# Patient Record
Sex: Female | Born: 1935 | Race: Black or African American | Hispanic: No
Health system: Southern US, Community
[De-identification: ages and names within clinical notes are randomized; demographics above are authoritative.]

---

## 2009-05-01 ENCOUNTER — Inpatient Hospital Stay (HOSPITAL_COMMUNITY): Admission: EM | Admit: 2009-05-01 | Discharge: 2009-05-16 | Payer: Self-pay | Admitting: Emergency Medicine

## 2009-05-01 ENCOUNTER — Ambulatory Visit: Payer: Self-pay | Admitting: Pulmonary Disease

## 2009-05-01 ENCOUNTER — Encounter (INDEPENDENT_AMBULATORY_CARE_PROVIDER_SITE_OTHER): Payer: Self-pay | Admitting: Neurology

## 2009-05-01 ENCOUNTER — Ambulatory Visit: Payer: Self-pay | Admitting: Emergency Medicine

## 2009-05-02 ENCOUNTER — Encounter (INDEPENDENT_AMBULATORY_CARE_PROVIDER_SITE_OTHER): Payer: Self-pay | Admitting: Neurology

## 2009-05-08 ENCOUNTER — Encounter (INDEPENDENT_AMBULATORY_CARE_PROVIDER_SITE_OTHER): Payer: Self-pay | Admitting: Internal Medicine

## 2009-05-08 ENCOUNTER — Ambulatory Visit: Payer: Self-pay | Admitting: Internal Medicine

## 2009-05-08 ENCOUNTER — Ambulatory Visit: Payer: Self-pay | Admitting: Surgery

## 2009-05-09 ENCOUNTER — Encounter: Payer: Self-pay | Admitting: Internal Medicine

## 2009-05-16 ENCOUNTER — Inpatient Hospital Stay: Admission: EM | Admit: 2009-05-16 | Discharge: 2009-06-05 | Payer: Self-pay | Admitting: Internal Medicine

## 2010-02-22 IMAGING — CR DG KNEE 1-2V PORT*L*
2 series · 2 of 2 positions shown · non-contrast
Comparison: None

CLINICAL DATA: History given of pain in the left knee anterior
aspect with no known injury.

PORTABLE LEFT KNEE - 1-2 VIEW

[view not recorded (1 of 2)]
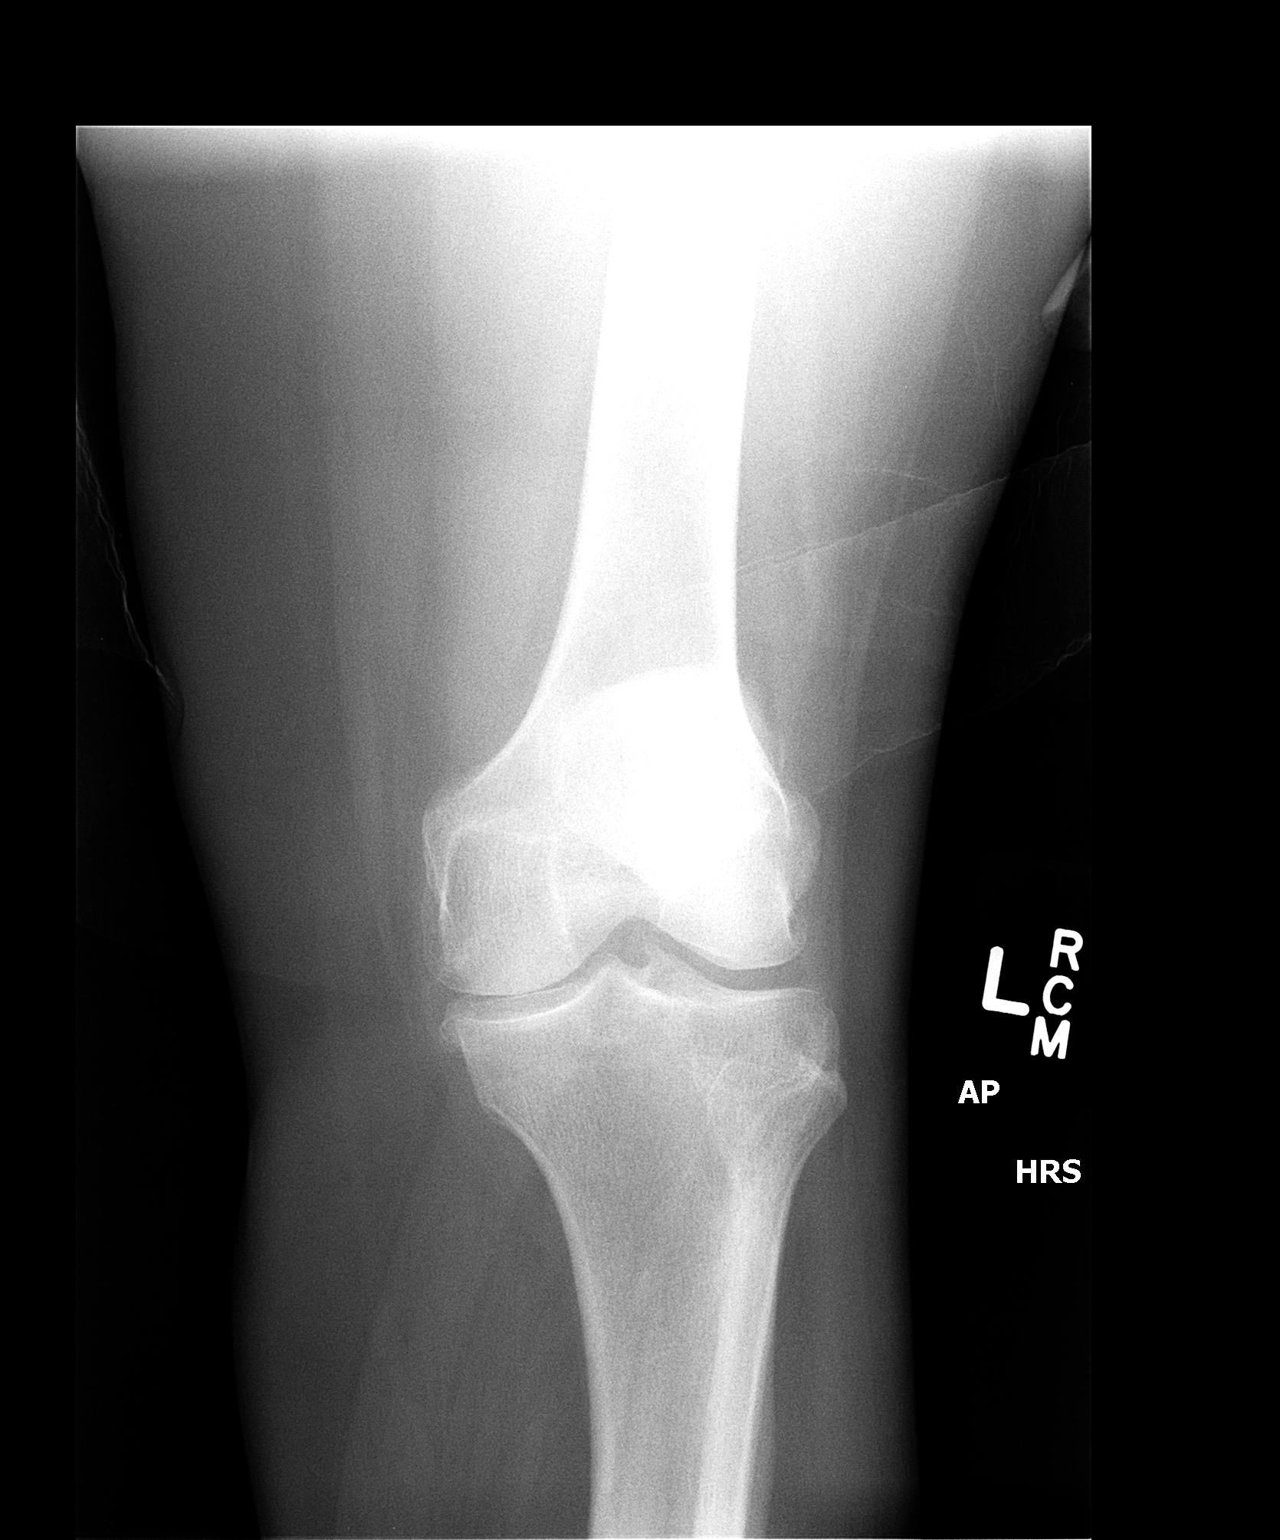

[view not recorded (2 of 2)]
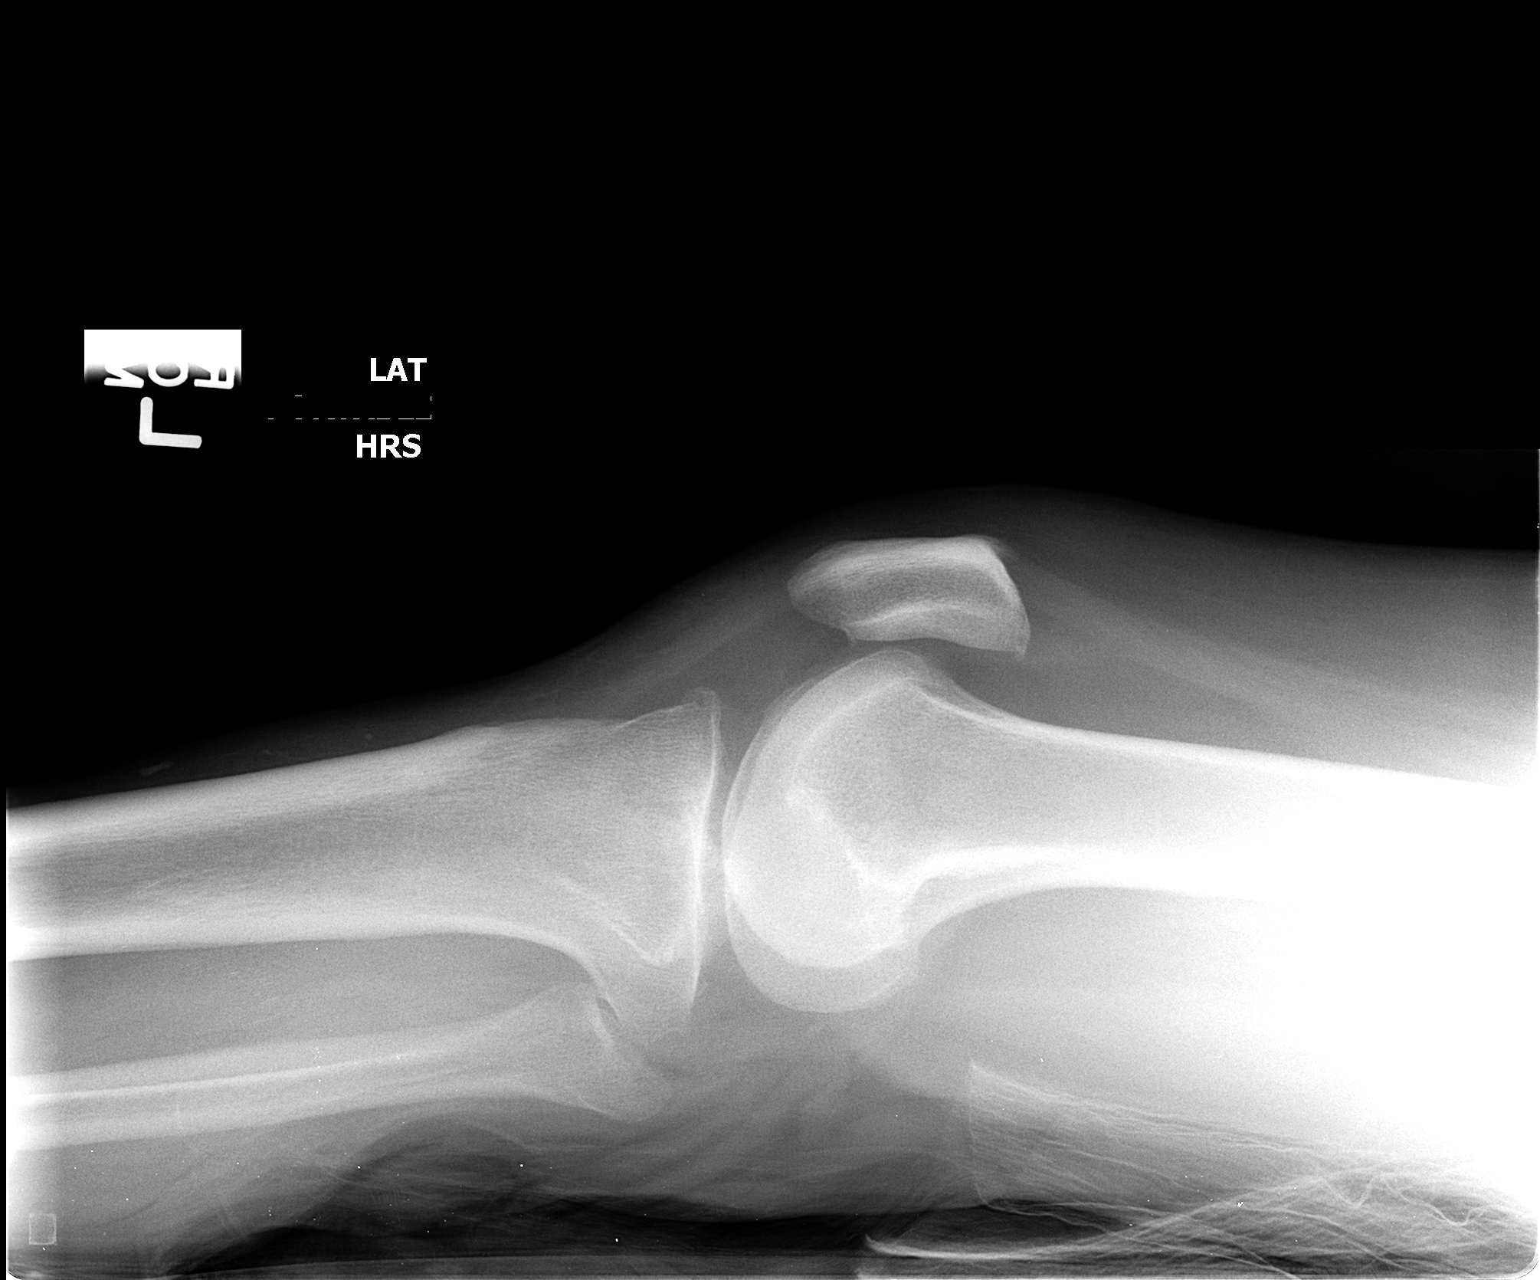

[2 of 2 positions shown; findings below may reference images not displayed]

FINDINGS: No joint effusion was evident.  There is minimal patellar
spurring.  No fracture or bony destruction is seen. There is a
mildly osteopenic appearance of the bones. There is minimal
marginal osteophyte formation involving the proximal medial aspect
of tibia.
IMPRESSION: No fracture or joint effusion is seen.  Minimal spurring is seen.
There is an osteopenic appearance of the bones.

## 2010-02-22 IMAGING — CR DG HIP 1V PORT*L*
1 series · 1 of 1 positions shown · non-contrast
Comparison: None

CLINICAL DATA: History of left hip pain with no known injury.

PORTABLE LEFT HIP - 1 VIEW

[view not recorded]
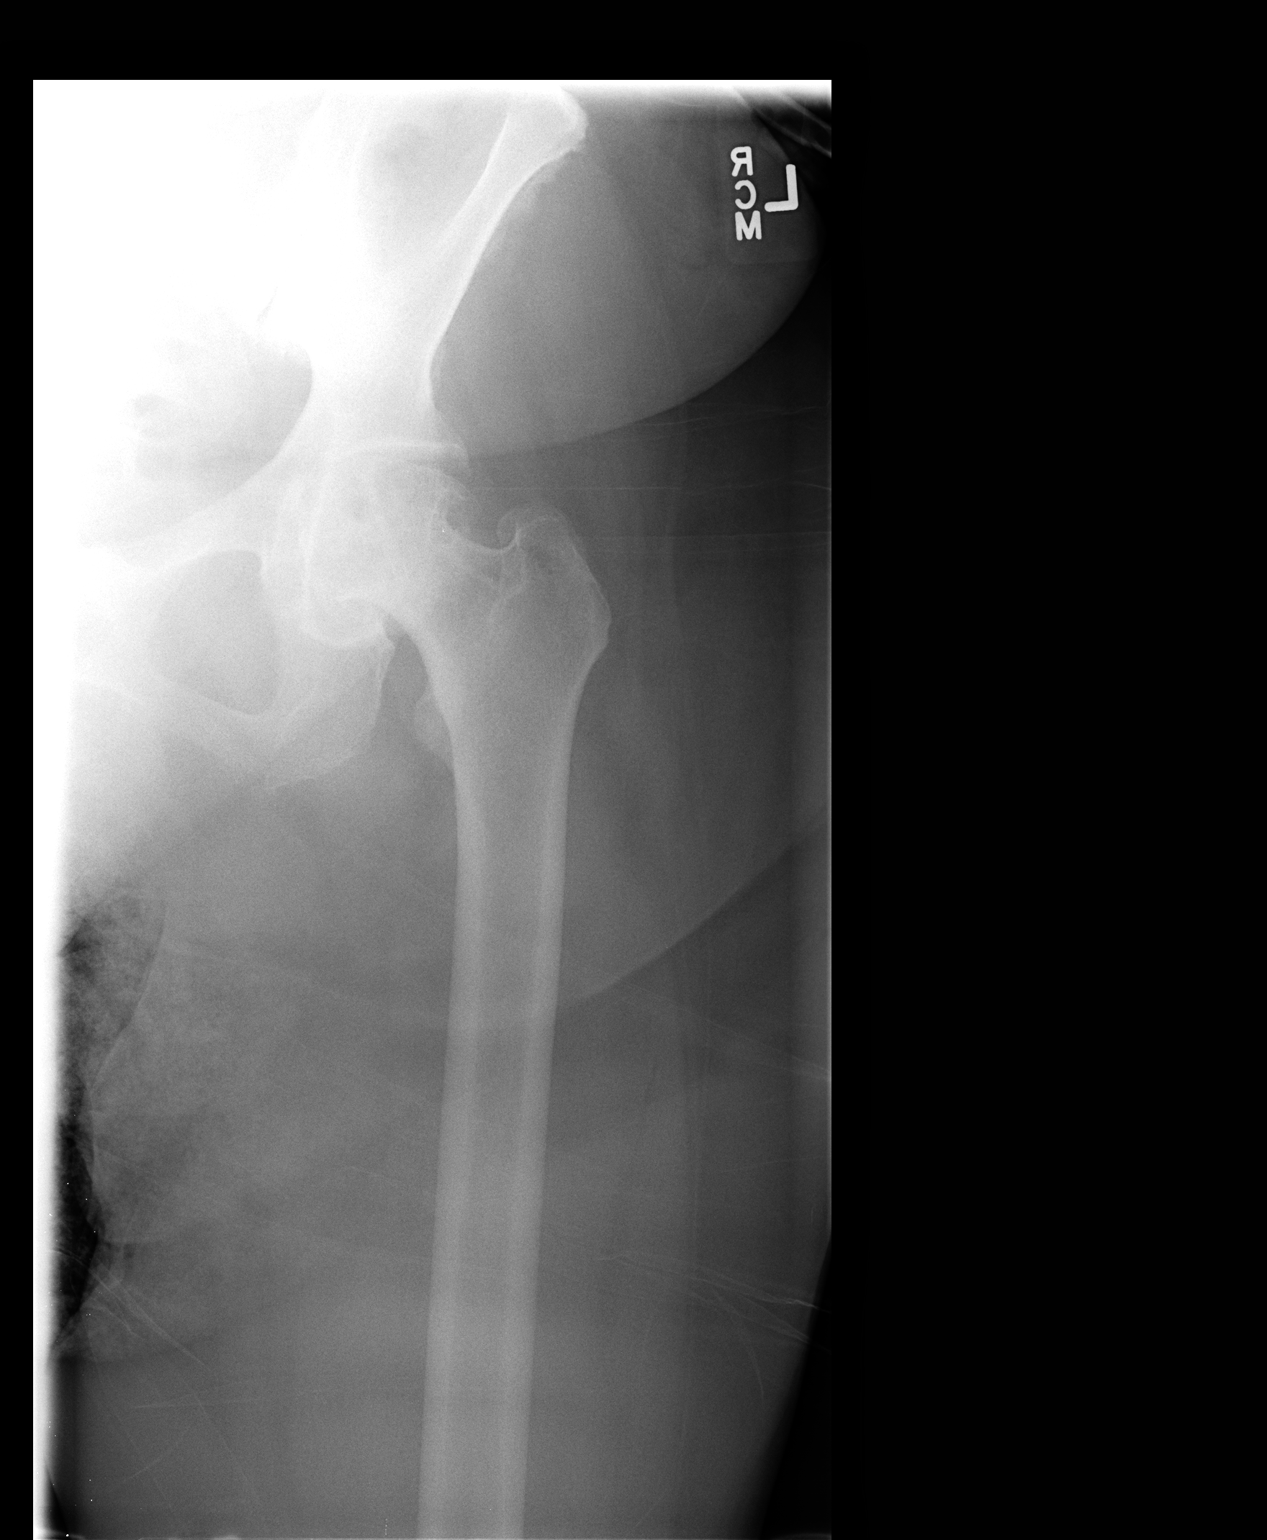

[1 of 1 positions shown; findings below may reference images not displayed]

FINDINGS: Portable AP image was obtained.  There is narrowing of
the superior joint space and medial joint space.  There is marginal
osteophyte formation.  There is an osteopenic appearance the bones.
No fracture or bony destruction is seen to
IMPRESSION: There is a mildly osteopenic appearance of the bones. There is hip
osteoarthritis.

## 2010-02-23 IMAGING — CR DG CHEST 1V PORT
1 series · 1 of 1 positions shown · non-contrast
Comparison: 05/19/2009

CLINICAL DATA: Respiratory failure.  CVA.

PORTABLE CHEST - 1 VIEW

[view not recorded]
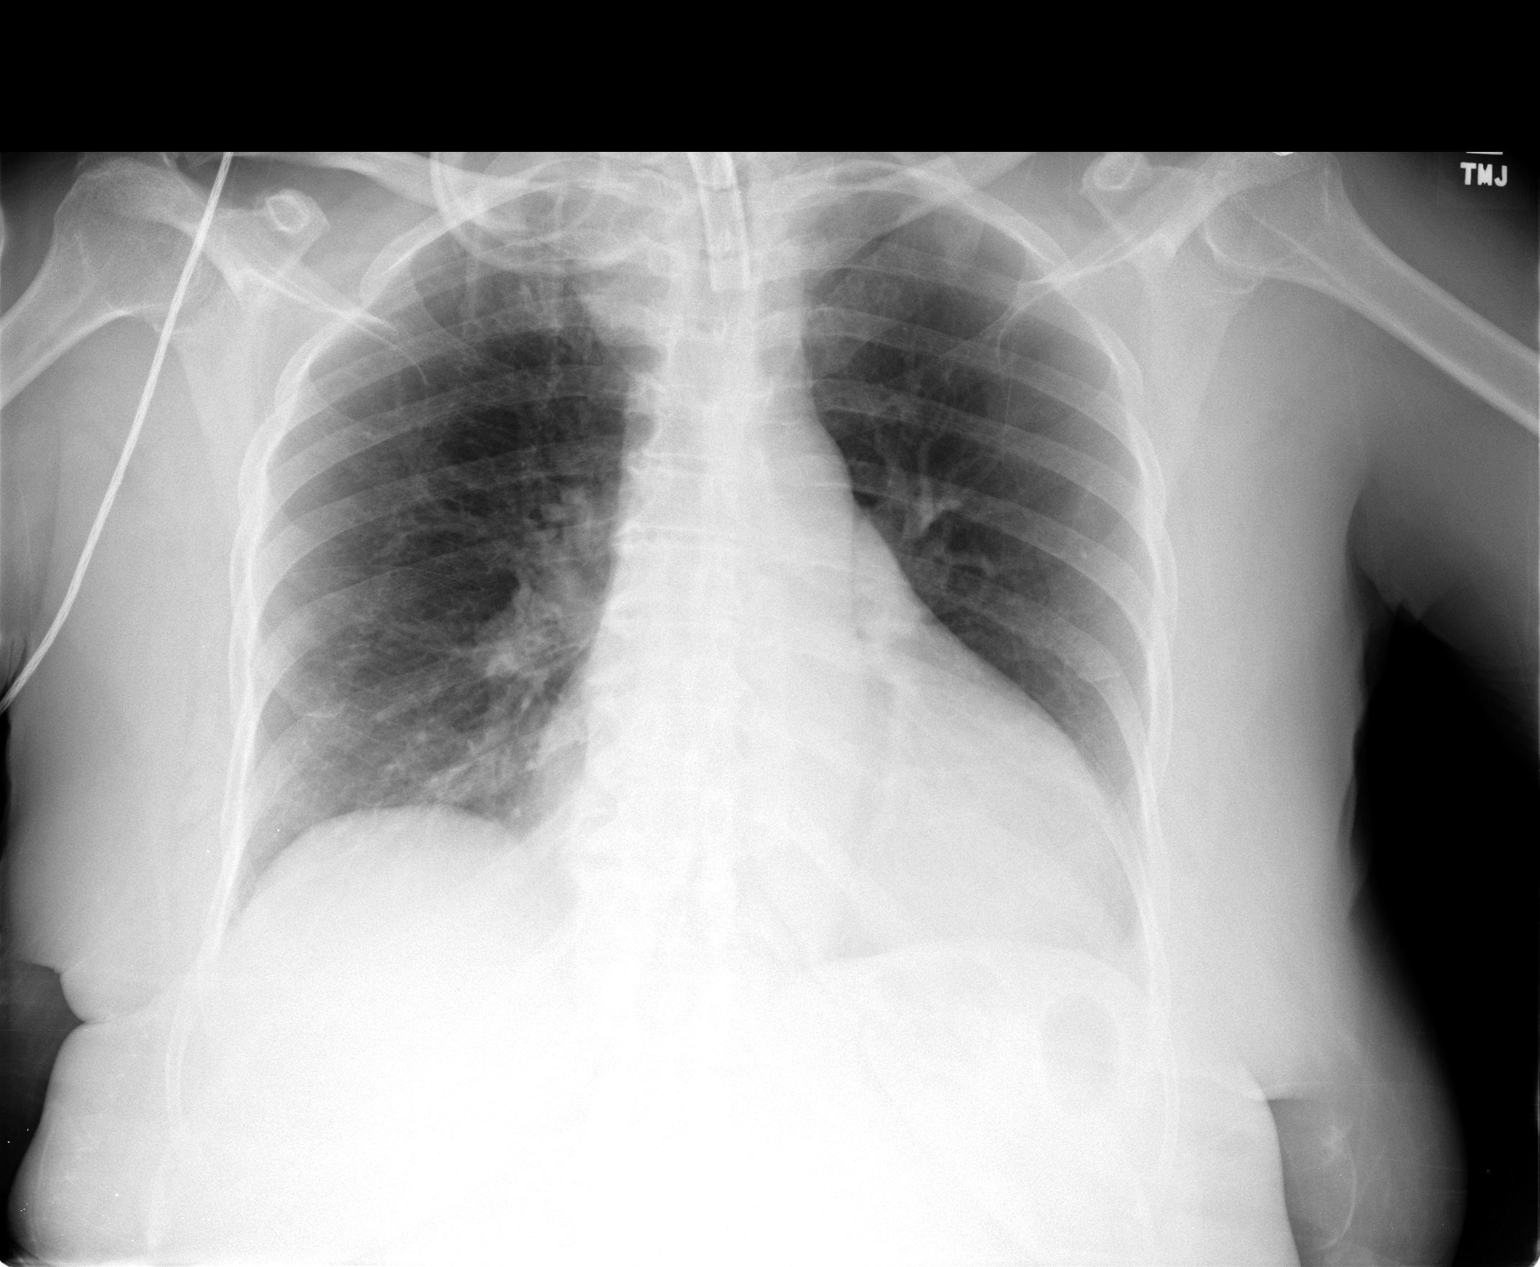

[1 of 1 positions shown; findings below may reference images not displayed]

FINDINGS: Cardiomegaly.  No acute chest findings.  Lungs well
expanded.  Tracheostomy is in satisfactory position.
IMPRESSION: Cardiomegaly.

## 2010-05-24 ENCOUNTER — Encounter: Payer: Self-pay | Admitting: Internal Medicine

## 2010-06-02 NOTE — Procedures (Signed)
Summary: Upper Endoscopy w/PEG  Patient: Olivia Anthony Note: All result statuses are Final unless otherwise noted.  Tests: (1) Upper Endoscopy w/PEG (UEP)  UEP Upper Endoscopy w/PEG                             DONE     [facility]     [address]     [place], [place]  [address]           OPERATIVE PROCEDURE REPORT           PATIENT:  Olivia Anthony, Olivia Anthony  MR#:  [id number]     BIRTHDATE:  1933/10/09, 75 yrs. old  GENDER:  female           ENDOSCOPIST:  Iva Boop, MD, [facility]           PROCEDURE DATE:  05/09/2009     PROCEDURE:  EGD with PEG placement     ASA CLASS:  Class III     INDICATIONS:  1) dysphagia after a stroke           MEDICATIONS:   Fentanyl 25 mcg IV, Versed 2 mg IV     TOPICAL ANESTHETIC:  none           DESCRIPTION OF PROCEDURE:  After the risks benefits and     alternatives of the procedure were thoroughly explained, informed     consent was obtained.  The  endoscope was introduced through the     mouth and advanced to the second portion of the duodenum, without     limitations.  The instrument was slowly withdrawn as the mucosa     was fully examined.           The upper, middle, and distal third of the esophagus were     carefully inspected and no abnormalities were noted. The z-line     was well seen at the GEJ. The endoscope was pushed into the fundus     which was normal including a retroflexed view. The antrum, first     and second part of the duodenum were unremarkable.     <<PROCEDUREIMAGES>>           The stomach was then inflated with air, and by a combination of     transillumination and manual palpation, the site for the     gastrostomy tube placement was selected and marked on the anterior     abdominal wall.  The skin of the anterior abdomen was surgically     prepped and draped with sterile towels.           Utilizing strict sterile technique, the selected site was then     anesthetized with 1% xylocaine by injection into  the skin and     subcutaneous tissue.  A 1 cm incision was made through the skin     and subcutaneous tissue, and the needle/cannula assembly was then     passed through the abdominal wall and through the anterior wall of     the stomach, maintaining visualization with the endoscope.  A     snare device previously placed through the instrument channel was     then opened and placed around the cannula, the needle was removed,     and the insertion wire was passed through the cannula and into the     stomach lumen.  The snare was then  loosened from the cannula, and     repositioned to snare the insertion wire.  The snare was then     pulled up to the endoscope distal tip, and the scope was then     withdrawn bringing with it the snare and insertion wire.           The insertion wire was then released from the snare, and then     loop-attached to the [employer] gastrostomy tube.     Using the "push technique", the G-tube was then placed The G-tube     insertion site was then cleansed once again, and the external     bolster was placed over the tube to secure it to the abdominal     wall.  A sterile dressing was then applied, and the procedure     terminated.           COMPLICATIONS:  None           ENDOSCOPIC IMPRESSION:     1) Normal EGD - successful PEG     RECOMMENDATIONS:     routine post-PEG care           REPEAT EXAM:  as needed           Iva Boop, MD, [facility]           n.     eSIGNED:   Iva Boop at 05/09/2009 05:30 PM           Simon Rhein, [id number]  Note: An exclamation mark (!) indicates a result that was not dispersed into the flowsheet. Document Creation Date: 05/09/2009 5:30 PM _______________________________________________________________________  (1) Order result status: Final Collection or observation date-time: 05/09/2009 15:54 Requested date-time:  Receipt date-time:  Reported date-time:  Referring Physician:   Ordering  Physician: Stan Head [id number]) Specimen Source:  Source: Launa Grill Order Number: [id number] Lab site:

## 2010-07-19 LAB — BASIC METABOLIC PANEL
BUN: 13 mg/dL (ref 6–23)
BUN: 13 mg/dL (ref 6–23)
BUN: 16 mg/dL (ref 6–23)
BUN: 23 mg/dL (ref 6–23)
BUN: 23 mg/dL (ref 6–23)
BUN: 6 mg/dL (ref 6–23)
BUN: 8 mg/dL (ref 6–23)
CO2: 29 mEq/L (ref 19–32)
CO2: 31 mEq/L (ref 19–32)
Calcium: 9.1 mg/dL (ref 8.4–10.5)
Calcium: 9.3 mg/dL (ref 8.4–10.5)
Calcium: 9.4 mg/dL (ref 8.4–10.5)
Calcium: 9.7 mg/dL (ref 8.4–10.5)
Calcium: 9.9 mg/dL (ref 8.4–10.5)
Calcium: 9.9 mg/dL (ref 8.4–10.5)
Chloride: 107 mEq/L (ref 96–112)
Chloride: 114 mEq/L — ABNORMAL HIGH (ref 96–112)
Chloride: 115 mEq/L — ABNORMAL HIGH (ref 96–112)
Chloride: 98 mEq/L (ref 96–112)
Creatinine, Ser: 0.7 mg/dL (ref 0.4–1.2)
Creatinine, Ser: 0.7 mg/dL (ref 0.4–1.2)
Creatinine, Ser: 0.9 mg/dL (ref 0.4–1.2)
Creatinine, Ser: 0.92 mg/dL (ref 0.4–1.2)
Creatinine, Ser: 0.94 mg/dL (ref 0.4–1.2)
GFR calc Af Amer: >60 mL/min (ref >60)
GFR calc Af Amer: >60 mL/min (ref >60)
GFR calc Af Amer: >60 mL/min (ref >60)
GFR calc Af Amer: >60 mL/min (ref >60)
GFR calc non Af Amer: >60 mL/min (ref >60)
GFR calc non Af Amer: >60 mL/min (ref >60)
GFR calc non Af Amer: >60 mL/min (ref >60)
GFR calc non Af Amer: >60 mL/min (ref >60)
GFR calc non Af Amer: >60 mL/min (ref >60)
GFR calc non Af Amer: >60 mL/min (ref >60)
GFR calc non Af Amer: >60 mL/min (ref >60)
Glucose, Bld: 102 mg/dL — ABNORMAL HIGH (ref 70–99)
Glucose, Bld: 135 mg/dL — ABNORMAL HIGH (ref 70–99)
Glucose, Bld: 139 mg/dL — ABNORMAL HIGH (ref 70–99)
Glucose, Bld: 148 mg/dL — ABNORMAL HIGH (ref 70–99)
Glucose, Bld: 176 mg/dL — ABNORMAL HIGH (ref 70–99)
Glucose, Bld: 179 mg/dL — ABNORMAL HIGH (ref 70–99)
Potassium: 3.2 mEq/L — ABNORMAL LOW (ref 3.5–5.1)
Potassium: 3.6 mEq/L (ref 3.5–5.1)
Potassium: 3.8 mEq/L (ref 3.5–5.1)
Potassium: 4.1 mEq/L (ref 3.5–5.1)
Sodium: 140 mEq/L (ref 135–145)
Sodium: 146 mEq/L — ABNORMAL HIGH (ref 135–145)
Sodium: 150 mEq/L — ABNORMAL HIGH (ref 135–145)
Sodium: 151 mEq/L — ABNORMAL HIGH (ref 135–145)

## 2010-07-19 LAB — CULTURE, BLOOD (ROUTINE X 2): Culture: NO GROWTH

## 2010-07-19 LAB — GLUCOSE, CAPILLARY
Glucose-Capillary: 100 mg/dL — ABNORMAL HIGH (ref 70–99)
Glucose-Capillary: 101 mg/dL — ABNORMAL HIGH (ref 70–99)
Glucose-Capillary: 103 mg/dL — ABNORMAL HIGH (ref 70–99)
Glucose-Capillary: 103 mg/dL — ABNORMAL HIGH (ref 70–99)
Glucose-Capillary: 104 mg/dL — ABNORMAL HIGH (ref 70–99)
Glucose-Capillary: 108 mg/dL — ABNORMAL HIGH (ref 70–99)
Glucose-Capillary: 108 mg/dL — ABNORMAL HIGH (ref 70–99)
Glucose-Capillary: 108 mg/dL — ABNORMAL HIGH (ref 70–99)
Glucose-Capillary: 111 mg/dL — ABNORMAL HIGH (ref 70–99)
Glucose-Capillary: 112 mg/dL — ABNORMAL HIGH (ref 70–99)
Glucose-Capillary: 113 mg/dL — ABNORMAL HIGH (ref 70–99)
Glucose-Capillary: 115 mg/dL — ABNORMAL HIGH (ref 70–99)
Glucose-Capillary: 116 mg/dL — ABNORMAL HIGH (ref 70–99)
Glucose-Capillary: 119 mg/dL — ABNORMAL HIGH (ref 70–99)
Glucose-Capillary: 120 mg/dL — ABNORMAL HIGH (ref 70–99)
Glucose-Capillary: 125 mg/dL — ABNORMAL HIGH (ref 70–99)
Glucose-Capillary: 125 mg/dL — ABNORMAL HIGH (ref 70–99)
Glucose-Capillary: 125 mg/dL — ABNORMAL HIGH (ref 70–99)
Glucose-Capillary: 126 mg/dL — ABNORMAL HIGH (ref 70–99)
Glucose-Capillary: 126 mg/dL — ABNORMAL HIGH (ref 70–99)
Glucose-Capillary: 129 mg/dL — ABNORMAL HIGH (ref 70–99)
Glucose-Capillary: 130 mg/dL — ABNORMAL HIGH (ref 70–99)
Glucose-Capillary: 135 mg/dL — ABNORMAL HIGH (ref 70–99)
Glucose-Capillary: 136 mg/dL — ABNORMAL HIGH (ref 70–99)
Glucose-Capillary: 136 mg/dL — ABNORMAL HIGH (ref 70–99)
Glucose-Capillary: 137 mg/dL — ABNORMAL HIGH (ref 70–99)
Glucose-Capillary: 137 mg/dL — ABNORMAL HIGH (ref 70–99)
Glucose-Capillary: 143 mg/dL — ABNORMAL HIGH (ref 70–99)
Glucose-Capillary: 144 mg/dL — ABNORMAL HIGH (ref 70–99)
Glucose-Capillary: 148 mg/dL — ABNORMAL HIGH (ref 70–99)
Glucose-Capillary: 152 mg/dL — ABNORMAL HIGH (ref 70–99)
Glucose-Capillary: 159 mg/dL — ABNORMAL HIGH (ref 70–99)
Glucose-Capillary: 160 mg/dL — ABNORMAL HIGH (ref 70–99)
Glucose-Capillary: 226 mg/dL — ABNORMAL HIGH (ref 70–99)
Glucose-Capillary: 91 mg/dL (ref 70–99)
Glucose-Capillary: 93 mg/dL (ref 70–99)
Glucose-Capillary: 94 mg/dL (ref 70–99)
Glucose-Capillary: 97 mg/dL (ref 70–99)

## 2010-07-19 LAB — FOLATE RBC: RBC Folate: 1415 ng/mL — ABNORMAL HIGH (ref 180–600)

## 2010-07-19 LAB — BLOOD GAS, ARTERIAL
Acid-Base Excess: 1.6 mmol/L (ref 0.0–2.0)
Acid-Base Excess: 5.2 mmol/L — ABNORMAL HIGH (ref 0.0–2.0)
Bicarbonate: 28.2 mEq/L — ABNORMAL HIGH (ref 20.0–24.0)
Drawn by: 320991
FIO2: 0.3 %
FIO2: 1 %
MECHVT: 500 mL
O2 Saturation: 100 %
PEEP: 5 cmH2O
Patient temperature: 98.6
RATE: 16 resp/min
TCO2: 29.2 mmol/L (ref 0–100)
pCO2 arterial: 34.1 mmHg — ABNORMAL LOW (ref 35.0–45.0)
pO2, Arterial: 116 mmHg — ABNORMAL HIGH (ref 80.0–100.0)

## 2010-07-19 LAB — COMPREHENSIVE METABOLIC PANEL
ALT: 21 U/L (ref 0–35)
ALT: 26 U/L (ref 0–35)
ALT: 29 U/L (ref 0–35)
AST: 19 U/L (ref 0–37)
AST: 27 U/L (ref 0–37)
AST: 36 U/L (ref 0–37)
Albumin: 2.5 g/dL — ABNORMAL LOW (ref 3.5–5.2)
Alkaline Phosphatase: 85 U/L (ref 39–117)
Alkaline Phosphatase: 93 U/L (ref 39–117)
CO2: 28 mEq/L (ref 19–32)
CO2: 29 mEq/L (ref 19–32)
Calcium: 8.3 mg/dL — ABNORMAL LOW (ref 8.4–10.5)
Calcium: 9.6 mg/dL (ref 8.4–10.5)
Chloride: 111 mEq/L (ref 96–112)
Chloride: 95 mEq/L — ABNORMAL LOW (ref 96–112)
Creatinine, Ser: 0.76 mg/dL (ref 0.4–1.2)
GFR calc Af Amer: >60 mL/min (ref >60)
GFR calc Af Amer: >60 mL/min (ref >60)
GFR calc Af Amer: >60 mL/min (ref >60)
GFR calc non Af Amer: >60 mL/min (ref >60)
GFR calc non Af Amer: >60 mL/min (ref >60)
Glucose, Bld: 106 mg/dL — ABNORMAL HIGH (ref 70–99)
Glucose, Bld: 124 mg/dL — ABNORMAL HIGH (ref 70–99)
Potassium: 3.6 mEq/L (ref 3.5–5.1)
Potassium: 4.2 mEq/L (ref 3.5–5.1)
Sodium: 133 mEq/L — ABNORMAL LOW (ref 135–145)
Sodium: 153 mEq/L — ABNORMAL HIGH (ref 135–145)
Sodium: 154 mEq/L — ABNORMAL HIGH (ref 135–145)
Total Bilirubin: 0.4 mg/dL (ref 0.3–1.2)
Total Protein: 6.1 g/dL (ref 6.0–8.3)
Total Protein: 6.8 g/dL (ref 6.0–8.3)

## 2010-07-19 LAB — CBC
HCT: 25 % — ABNORMAL LOW (ref 36.0–46.0)
HCT: 27.4 % — ABNORMAL LOW (ref 36.0–46.0)
HCT: 27.5 % — ABNORMAL LOW (ref 36.0–46.0)
HCT: 29.8 % — ABNORMAL LOW (ref 36.0–46.0)
HCT: 29.9 % — ABNORMAL LOW (ref 36.0–46.0)
Hemoglobin: 8 g/dL — ABNORMAL LOW (ref 12.0–15.0)
Hemoglobin: 8.6 g/dL — ABNORMAL LOW (ref 12.0–15.0)
Hemoglobin: 9 g/dL — ABNORMAL LOW (ref 12.0–15.0)
Hemoglobin: 9.1 g/dL — ABNORMAL LOW (ref 12.0–15.0)
Hemoglobin: 9.3 g/dL — ABNORMAL LOW (ref 12.0–15.0)
Hemoglobin: 9.8 g/dL — ABNORMAL LOW (ref 12.0–15.0)
MCHC: 33.3 g/dL (ref 30.0–36.0)
MCHC: 33.6 g/dL (ref 30.0–36.0)
MCHC: 34.2 g/dL (ref 30.0–36.0)
MCV: 86.1 fL (ref 78.0–100.0)
MCV: 86.5 fL (ref 78.0–100.0)
MCV: 86.7 fL (ref 78.0–100.0)
MCV: 86.9 fL (ref 78.0–100.0)
MCV: 87.7 fL (ref 78.0–100.0)
MCV: 87.9 fL (ref 78.0–100.0)
MCV: 88.3 fL (ref 78.0–100.0)
Platelets: 171 10*3/uL (ref 150–400)
Platelets: 188 10*3/uL (ref 150–400)
Platelets: 311 10*3/uL (ref 150–400)
Platelets: 373 10*3/uL (ref 150–400)
Platelets: 395 10*3/uL (ref 150–400)
Platelets: 486 10*3/uL — ABNORMAL HIGH (ref 150–400)
RBC: 2.9 MIL/uL — ABNORMAL LOW (ref 3.87–5.11)
RBC: 2.94 MIL/uL — ABNORMAL LOW (ref 3.87–5.11)
RBC: 3.03 MIL/uL — ABNORMAL LOW (ref 3.87–5.11)
RBC: 3.13 MIL/uL — ABNORMAL LOW (ref 3.87–5.11)
RBC: 3.16 MIL/uL — ABNORMAL LOW (ref 3.87–5.11)
RBC: 3.29 MIL/uL — ABNORMAL LOW (ref 3.87–5.11)
RBC: 3.38 MIL/uL — ABNORMAL LOW (ref 3.87–5.11)
RBC: 3.39 MIL/uL — ABNORMAL LOW (ref 3.87–5.11)
RDW: 13.9 % (ref 11.5–15.5)
RDW: 14.6 % (ref 11.5–15.5)
RDW: 14.6 % (ref 11.5–15.5)
RDW: 14.7 % (ref 11.5–15.5)
WBC: 12.1 10*3/uL — ABNORMAL HIGH (ref 4.0–10.5)
WBC: 14.1 10*3/uL — ABNORMAL HIGH (ref 4.0–10.5)
WBC: 16.4 10*3/uL — ABNORMAL HIGH (ref 4.0–10.5)
WBC: 17.8 10*3/uL — ABNORMAL HIGH (ref 4.0–10.5)
WBC: 19.5 10*3/uL — ABNORMAL HIGH (ref 4.0–10.5)

## 2010-07-19 LAB — CULTURE, RESPIRATORY W GRAM STAIN

## 2010-07-19 LAB — URINALYSIS, ROUTINE W REFLEX MICROSCOPIC
Bilirubin Urine: NEGATIVE
Ketones, ur: NEGATIVE mg/dL
Specific Gravity, Urine: 1.017 (ref 1.005–1.030)
pH: 5 (ref 5.0–8.0)

## 2010-07-19 LAB — URINALYSIS, MICROSCOPIC ONLY
Glucose, UA: NEGATIVE mg/dL
Ketones, ur: NEGATIVE mg/dL
Protein, ur: 30 mg/dL — AB
Urobilinogen, UA: 1 mg/dL (ref 0.0–1.0)

## 2010-07-19 LAB — PROTIME-INR
INR: 1.32 (ref 0.00–1.49)
Prothrombin Time: 16.3 seconds — ABNORMAL HIGH (ref 11.6–15.2)

## 2010-07-19 LAB — DIFFERENTIAL
Basophils Relative: 0 % (ref 0–1)
Eosinophils Absolute: 0.3 10*3/uL (ref 0.0–0.7)
Eosinophils Relative: 2 % (ref 0–5)
Lymphocytes Relative: 12 % (ref 12–46)
Lymphocytes Relative: 13 % (ref 12–46)
Lymphs Abs: 1.6 10*3/uL (ref 0.7–4.0)
Lymphs Abs: 2 10*3/uL (ref 0.7–4.0)
Monocytes Absolute: 0.9 10*3/uL (ref 0.1–1.0)
Monocytes Relative: 6 % (ref 3–12)
Monocytes Relative: 7 % (ref 3–12)
Neutro Abs: 13 10*3/uL — ABNORMAL HIGH (ref 1.7–7.7)

## 2010-07-19 LAB — PHOSPHORUS
Phosphorus: 3.4 mg/dL (ref 2.3–4.6)
Phosphorus: 3.5 mg/dL (ref 2.3–4.6)

## 2010-07-19 LAB — URINE CULTURE
Culture: NO GROWTH
Culture: NO GROWTH

## 2010-07-19 LAB — VITAMIN B12: Vitamin B-12: 718 pg/mL (ref 211–911)

## 2010-07-19 LAB — PREALBUMIN: Prealbumin: 12.7 mg/dL — ABNORMAL LOW (ref 18.0–45.0)

## 2010-07-19 LAB — MAGNESIUM: Magnesium: 2 mg/dL (ref 1.5–2.5)

## 2010-07-19 LAB — CLOSTRIDIUM DIFFICILE EIA

## 2010-07-19 LAB — LACTIC ACID, PLASMA: Lactic Acid, Venous: 1.9 mmol/L (ref 0.5–2.2)

## 2010-07-19 LAB — BRAIN NATRIURETIC PEPTIDE: Pro B Natriuretic peptide (BNP): 140 pg/mL — ABNORMAL HIGH (ref 0.0–100.0)

## 2010-07-19 LAB — TSH: TSH: 2.869 u[IU]/mL (ref 0.350–4.500)

## 2010-07-20 LAB — URINALYSIS, MICROSCOPIC ONLY
Glucose, UA: NEGATIVE mg/dL
Hgb urine dipstick: NEGATIVE
Specific Gravity, Urine: 1.022 (ref 1.005–1.030)
Urobilinogen, UA: 0.2 mg/dL (ref 0.0–1.0)

## 2010-07-20 LAB — BASIC METABOLIC PANEL
BUN: 38 mg/dL — ABNORMAL HIGH (ref 6–23)
BUN: 41 mg/dL — ABNORMAL HIGH (ref 6–23)
CO2: 27 mEq/L (ref 19–32)
CO2: 30 mEq/L (ref 19–32)
CO2: 32 mEq/L (ref 19–32)
Calcium: 10 mg/dL (ref 8.4–10.5)
Calcium: 10.3 mg/dL (ref 8.4–10.5)
Calcium: 9.9 mg/dL (ref 8.4–10.5)
Calcium: 9.9 mg/dL (ref 8.4–10.5)
Chloride: 103 mEq/L (ref 96–112)
Creatinine, Ser: 0.77 mg/dL (ref 0.4–1.2)
GFR calc non Af Amer: 60 mL/min — ABNORMAL LOW (ref >60)
GFR calc non Af Amer: >60 mL/min (ref >60)
GFR calc non Af Amer: >60 mL/min (ref >60)
Glucose, Bld: 131 mg/dL — ABNORMAL HIGH (ref 70–99)
Glucose, Bld: 145 mg/dL — ABNORMAL HIGH (ref 70–99)
Glucose, Bld: 146 mg/dL — ABNORMAL HIGH (ref 70–99)
Glucose, Bld: 150 mg/dL — ABNORMAL HIGH (ref 70–99)
Potassium: 4.3 mEq/L (ref 3.5–5.1)
Potassium: 4.8 mEq/L (ref 3.5–5.1)
Potassium: 5.1 mEq/L (ref 3.5–5.1)
Sodium: 133 mEq/L — ABNORMAL LOW (ref 135–145)
Sodium: 137 mEq/L (ref 135–145)
Sodium: 141 mEq/L (ref 135–145)

## 2010-07-20 LAB — COMPREHENSIVE METABOLIC PANEL
ALT: 25 U/L (ref 0–35)
ALT: 44 U/L — ABNORMAL HIGH (ref 0–35)
AST: 26 U/L (ref 0–37)
AST: 31 U/L (ref 0–37)
Albumin: 2.7 g/dL — ABNORMAL LOW (ref 3.5–5.2)
Alkaline Phosphatase: 89 U/L (ref 39–117)
CO2: 27 mEq/L (ref 19–32)
Calcium: 10.4 mg/dL (ref 8.4–10.5)
Chloride: 108 mEq/L (ref 96–112)
Creatinine, Ser: 1.25 mg/dL — ABNORMAL HIGH (ref 0.4–1.2)
GFR calc Af Amer: 51 mL/min — ABNORMAL LOW (ref >60)
GFR calc Af Amer: >60 mL/min (ref >60)
GFR calc non Af Amer: >60 mL/min (ref >60)
Glucose, Bld: 139 mg/dL — ABNORMAL HIGH (ref 70–99)
Potassium: 3.7 mEq/L (ref 3.5–5.1)
Sodium: 145 mEq/L (ref 135–145)
Sodium: 149 mEq/L — ABNORMAL HIGH (ref 135–145)

## 2010-07-20 LAB — FERRITIN: Ferritin: 377 ng/mL — ABNORMAL HIGH (ref 10–291)

## 2010-07-20 LAB — CLOSTRIDIUM DIFFICILE EIA
C difficile Toxins A+B, EIA: 19
C difficile Toxins A+B, EIA: NEGATIVE

## 2010-07-20 LAB — CBC
HCT: 27.4 % — ABNORMAL LOW (ref 36.0–46.0)
HCT: 28.6 % — ABNORMAL LOW (ref 36.0–46.0)
HCT: 28.8 % — ABNORMAL LOW (ref 36.0–46.0)
Hemoglobin: 9.3 g/dL — ABNORMAL LOW (ref 12.0–15.0)
Hemoglobin: 9.5 g/dL — ABNORMAL LOW (ref 12.0–15.0)
Hemoglobin: 9.5 g/dL — ABNORMAL LOW (ref 12.0–15.0)
Hemoglobin: 9.7 g/dL — ABNORMAL LOW (ref 12.0–15.0)
MCHC: 32.6 g/dL (ref 30.0–36.0)
MCHC: 33 g/dL (ref 30.0–36.0)
MCHC: 33.1 g/dL (ref 30.0–36.0)
MCHC: 33.2 g/dL (ref 30.0–36.0)
MCV: 85.5 fL (ref 78.0–100.0)
MCV: 87.3 fL (ref 78.0–100.0)
Platelets: 339 10*3/uL (ref 150–400)
Platelets: 538 10*3/uL — ABNORMAL HIGH (ref 150–400)
Platelets: 548 10*3/uL — ABNORMAL HIGH (ref 150–400)
RBC: 3.39 MIL/uL — ABNORMAL LOW (ref 3.87–5.11)
RBC: 3.39 MIL/uL — ABNORMAL LOW (ref 3.87–5.11)
RDW: 14.2 % (ref 11.5–15.5)
RDW: 14.3 % (ref 11.5–15.5)
RDW: 14.3 % (ref 11.5–15.5)
RDW: 14.4 % (ref 11.5–15.5)
RDW: 14.6 % (ref 11.5–15.5)
WBC: 13.3 10*3/uL — ABNORMAL HIGH (ref 4.0–10.5)
WBC: 14.4 10*3/uL — ABNORMAL HIGH (ref 4.0–10.5)
WBC: 15.4 10*3/uL — ABNORMAL HIGH (ref 4.0–10.5)

## 2010-07-20 LAB — HEMOGLOBIN A1C: Hgb A1c MFr Bld: 6.2 % — ABNORMAL HIGH (ref 4.6–6.1)

## 2010-07-20 LAB — RETICULOCYTES: Retic Ct Pct: 2 % (ref 0.4–3.1)

## 2010-07-20 LAB — DIFFERENTIAL
Basophils Relative: 0 % (ref 0–1)
Eosinophils Absolute: 0.4 10*3/uL (ref 0.0–0.7)
Eosinophils Absolute: 0.5 10*3/uL (ref 0.0–0.7)
Eosinophils Relative: 3 % (ref 0–5)
Eosinophils Relative: 3 % (ref 0–5)
Lymphocytes Relative: 14 % (ref 12–46)
Lymphs Abs: 1.9 10*3/uL (ref 0.7–4.0)
Lymphs Abs: 2.3 10*3/uL (ref 0.7–4.0)
Monocytes Absolute: 0.6 10*3/uL (ref 0.1–1.0)
Monocytes Relative: 4 % (ref 3–12)
Neutrophils Relative %: 76 % (ref 43–77)

## 2010-07-20 LAB — CULTURE, BLOOD (ROUTINE X 2): Culture: NO GROWTH

## 2010-07-20 LAB — MAGNESIUM
Magnesium: 2 mg/dL (ref 1.5–2.5)
Magnesium: 2.2 mg/dL (ref 1.5–2.5)
Magnesium: 3.1 mg/dL — ABNORMAL HIGH (ref 1.5–2.5)

## 2010-07-20 LAB — IRON AND TIBC
Iron: 20 ug/dL — ABNORMAL LOW (ref 42–135)
Iron: 57 ug/dL (ref 42–135)
TIBC: 252 ug/dL (ref 250–470)
UIBC: 255 ug/dL

## 2010-07-20 LAB — PREALBUMIN: Prealbumin: 16.8 mg/dL — ABNORMAL LOW (ref 18.0–45.0)

## 2010-07-20 LAB — CULTURE, RESPIRATORY W GRAM STAIN

## 2010-07-23 LAB — BASIC METABOLIC PANEL
CO2: 28 mEq/L (ref 19–32)
Calcium: 10.2 mg/dL (ref 8.4–10.5)
Chloride: 114 mEq/L — ABNORMAL HIGH (ref 96–112)
GFR calc Af Amer: 60 mL/min — ABNORMAL LOW (ref >60)
Potassium: 4.8 mEq/L (ref 3.5–5.1)
Sodium: 149 mEq/L — ABNORMAL HIGH (ref 135–145)

## 2010-07-23 LAB — CBC
HCT: 29 % — ABNORMAL LOW (ref 36.0–46.0)
Hemoglobin: 9.4 g/dL — ABNORMAL LOW (ref 12.0–15.0)
MCHC: 32.5 g/dL (ref 30.0–36.0)
RBC: 3.35 MIL/uL — ABNORMAL LOW (ref 3.87–5.11)

## 2010-08-03 LAB — GLUCOSE, CAPILLARY
Glucose-Capillary: 100 mg/dL — ABNORMAL HIGH (ref 70–99)
Glucose-Capillary: 100 mg/dL — ABNORMAL HIGH (ref 70–99)
Glucose-Capillary: 106 mg/dL — ABNORMAL HIGH (ref 70–99)
Glucose-Capillary: 67 mg/dL — ABNORMAL LOW (ref 70–99)
Glucose-Capillary: 83 mg/dL (ref 70–99)

## 2010-08-03 LAB — BLOOD GAS, ARTERIAL
Acid-base deficit: 1 mmol/L (ref 0.0–2.0)
MECHVT: 400 mL
MECHVT: 550 mL
PEEP: 5 cmH2O
Patient temperature: 98.6
Patient temperature: 98.6
RATE: 15 resp/min
RATE: 15 resp/min
TCO2: 23.6 mmol/L (ref 0–100)
pCO2 arterial: 38.8 mmHg (ref 35.0–45.0)
pH, Arterial: 7.394 (ref 7.350–7.400)
pH, Arterial: 7.536 — ABNORMAL HIGH (ref 7.350–7.400)

## 2010-08-03 LAB — CBC
Hemoglobin: 9.4 g/dL — ABNORMAL LOW (ref 12.0–15.0)
MCHC: 33.1 g/dL (ref 30.0–36.0)
MCV: 87.4 fL (ref 78.0–100.0)
RBC: 3.19 MIL/uL — ABNORMAL LOW (ref 3.87–5.11)
RBC: 3.7 MIL/uL — ABNORMAL LOW (ref 3.87–5.11)
RDW: 14.4 % (ref 11.5–15.5)
WBC: 13.1 10*3/uL — ABNORMAL HIGH (ref 4.0–10.5)

## 2010-08-03 LAB — COMPREHENSIVE METABOLIC PANEL
ALT: 11 U/L (ref 0–35)
Alkaline Phosphatase: 98 U/L (ref 39–117)
BUN: 12 mg/dL (ref 6–23)
CO2: 30 mEq/L (ref 19–32)
Calcium: 9.4 mg/dL (ref 8.4–10.5)
GFR calc non Af Amer: >60 mL/min (ref >60)
Glucose, Bld: 125 mg/dL — ABNORMAL HIGH (ref 70–99)
Potassium: 2.6 mEq/L — CL (ref 3.5–5.1)
Sodium: 140 mEq/L (ref 135–145)

## 2010-08-03 LAB — DIFFERENTIAL
Basophils Relative: 0 % (ref 0–1)
Eosinophils Absolute: 0.1 10*3/uL (ref 0.0–0.7)
Neutrophils Relative %: 71 % (ref 43–77)

## 2010-08-03 LAB — URINALYSIS, ROUTINE W REFLEX MICROSCOPIC
Bilirubin Urine: NEGATIVE
Ketones, ur: NEGATIVE mg/dL
Nitrite: NEGATIVE
pH: 7 (ref 5.0–8.0)

## 2010-08-03 LAB — URINE DRUGS OF ABUSE SCREEN W ALC, ROUTINE (REF LAB)
Amphetamine Screen, Ur: NEGATIVE
Barbiturate Quant, Ur: NEGATIVE
Creatinine,U: 20.9 mg/dL
Methadone: NEGATIVE
Phencyclidine (PCP): NEGATIVE

## 2010-08-03 LAB — LIPID PANEL
LDL Cholesterol: 78 mg/dL (ref 0–99)
Triglycerides: 79 mg/dL (ref <150)

## 2010-08-03 LAB — BASIC METABOLIC PANEL
Calcium: 8.4 mg/dL (ref 8.4–10.5)
GFR calc Af Amer: >60 mL/min (ref >60)
GFR calc non Af Amer: >60 mL/min (ref >60)
Sodium: 143 mEq/L (ref 135–145)

## 2010-08-03 LAB — CK TOTAL AND CKMB (NOT AT ARMC): Relative Index: INVALID (ref 0.0–2.5)

## 2010-08-03 LAB — PROTIME-INR
INR: 1.11 (ref 0.00–1.49)
Prothrombin Time: 14.2 seconds (ref 11.6–15.2)

## 2010-08-03 LAB — HEMOGLOBIN A1C: Mean Plasma Glucose: 154 mg/dL

## 2010-08-03 LAB — HOMOCYSTEINE: Homocysteine: 13.8 umol/L (ref 4.0–15.4)

## 2020-02-11 ENCOUNTER — Ambulatory Visit: Payer: Self-pay | Attending: Internal Medicine

## 2020-02-11 DIAGNOSIS — Z23 Encounter for immunization: Secondary | ICD-10-CM

## 2020-02-11 NOTE — Progress Notes (Signed)
   Covid-19 Vaccination Clinic  Name:  Olivia Anthony    MRN: [id number] DOB: 09/09/1933  02/11/2020  Olivia Anthony was observed post Covid-19 immunization for 15 minutes without incident. She was provided with Vaccine Information Sheet and instruction to access the V-Safe system.   Olivia Anthony was instructed to call 911 with any severe reactions post vaccine: Marland Kitchen Difficulty breathing  . Swelling of face and throat  . A fast heartbeat  . A bad rash all over body  . Dizziness and weakness
